# Patient Record
Sex: Female | Born: 1950 | Race: White | Hispanic: No | Marital: Married | Smoking: Never smoker
Health system: Southern US, Community
[De-identification: ages and names within clinical notes are randomized; demographics above are authoritative.]

## PROBLEM LIST (undated history)

## (undated) DIAGNOSIS — T7840XA Allergy, unspecified, initial encounter: Secondary | ICD-10-CM

## (undated) DIAGNOSIS — M1990 Unspecified osteoarthritis, unspecified site: Secondary | ICD-10-CM

## (undated) DIAGNOSIS — E785 Hyperlipidemia, unspecified: Secondary | ICD-10-CM

## (undated) HISTORY — DX: Unspecified osteoarthritis, unspecified site: M19.90

## (undated) HISTORY — DX: Allergy, unspecified, initial encounter: T78.40XA

## (undated) HISTORY — DX: Hyperlipidemia, unspecified: E78.5

---

## 2018-10-25 DIAGNOSIS — E785 Hyperlipidemia, unspecified: Secondary | ICD-10-CM | POA: Diagnosis not present

## 2018-11-29 DIAGNOSIS — Z1231 Encounter for screening mammogram for malignant neoplasm of breast: Secondary | ICD-10-CM | POA: Diagnosis not present

## 2019-03-15 ENCOUNTER — Other Ambulatory Visit: Payer: Self-pay

## 2019-03-15 DIAGNOSIS — Z20822 Contact with and (suspected) exposure to covid-19: Secondary | ICD-10-CM

## 2019-03-17 LAB — NOVEL CORONAVIRUS, NAA: SARS-CoV-2, NAA: NOT DETECTED

## 2019-03-26 DIAGNOSIS — Z136 Encounter for screening for cardiovascular disorders: Secondary | ICD-10-CM | POA: Diagnosis not present

## 2019-03-26 DIAGNOSIS — Z0000 Encounter for general adult medical examination without abnormal findings: Secondary | ICD-10-CM | POA: Diagnosis not present

## 2019-03-28 DIAGNOSIS — Z0000 Encounter for general adult medical examination without abnormal findings: Secondary | ICD-10-CM | POA: Diagnosis not present

## 2019-03-28 DIAGNOSIS — E785 Hyperlipidemia, unspecified: Secondary | ICD-10-CM | POA: Diagnosis not present

## 2019-04-02 DIAGNOSIS — H524 Presbyopia: Secondary | ICD-10-CM | POA: Diagnosis not present

## 2019-04-09 DIAGNOSIS — D225 Melanocytic nevi of trunk: Secondary | ICD-10-CM | POA: Diagnosis not present

## 2019-04-09 DIAGNOSIS — L821 Other seborrheic keratosis: Secondary | ICD-10-CM | POA: Diagnosis not present

## 2019-04-09 DIAGNOSIS — D2271 Melanocytic nevi of right lower limb, including hip: Secondary | ICD-10-CM | POA: Diagnosis not present

## 2019-04-09 DIAGNOSIS — L814 Other melanin hyperpigmentation: Secondary | ICD-10-CM | POA: Diagnosis not present

## 2019-04-09 DIAGNOSIS — Z85828 Personal history of other malignant neoplasm of skin: Secondary | ICD-10-CM | POA: Diagnosis not present

## 2019-04-09 DIAGNOSIS — D485 Neoplasm of uncertain behavior of skin: Secondary | ICD-10-CM | POA: Diagnosis not present

## 2019-04-09 DIAGNOSIS — L570 Actinic keratosis: Secondary | ICD-10-CM | POA: Diagnosis not present

## 2019-04-18 DIAGNOSIS — H5203 Hypermetropia, bilateral: Secondary | ICD-10-CM | POA: Diagnosis not present

## 2019-04-18 DIAGNOSIS — H524 Presbyopia: Secondary | ICD-10-CM | POA: Diagnosis not present

## 2019-04-18 DIAGNOSIS — H52209 Unspecified astigmatism, unspecified eye: Secondary | ICD-10-CM | POA: Diagnosis not present

## 2019-05-16 DIAGNOSIS — Z23 Encounter for immunization: Secondary | ICD-10-CM | POA: Diagnosis not present

## 2019-05-25 ENCOUNTER — Ambulatory Visit: Payer: Self-pay

## 2019-06-02 ENCOUNTER — Ambulatory Visit: Payer: Medicare HMO | Attending: Internal Medicine

## 2019-06-02 DIAGNOSIS — Z23 Encounter for immunization: Secondary | ICD-10-CM | POA: Insufficient documentation

## 2019-06-02 NOTE — Progress Notes (Signed)
Covid-19 Vaccination Clinic  Name:  SHALEI NEYLON    MRN: [id number] DOB: April 09, 1951  06/02/2019  Ms. Dadisman was observed post Covid-19 immunization for 15 minutes without incidence. She was provided with Vaccine Information Sheet and instruction to access the V-Safe system.   Ms. Labossiere was instructed to call 911 with any severe reactions post vaccine: Marland Kitchen Difficulty breathing  . Swelling of your face and throat  . A fast heartbeat  . A bad rash all over your body  . Dizziness and weakness    Immunizations Administered    Name Date Dose VIS Date Route   Pfizer COVID-19 Vaccine 06/02/2019  3:11 PM 0.3 mL 04/06/2019 Intramuscular   Manufacturer: [employer], [employer]   Lot: [id number]   NDC: [address]-3

## 2019-06-15 ENCOUNTER — Ambulatory Visit: Payer: Self-pay

## 2019-06-27 ENCOUNTER — Ambulatory Visit: Payer: Medicare HMO | Attending: Internal Medicine

## 2019-06-27 DIAGNOSIS — Z23 Encounter for immunization: Secondary | ICD-10-CM | POA: Insufficient documentation

## 2019-06-27 NOTE — Progress Notes (Signed)
Covid-19 Vaccination Clinic  Name:  Rachel Fisher    MRN: [id number] DOB: 02/27/1951  06/27/2019  [name] was observed post Covid-19 immunization for 15 minutes without incident. She was provided with Vaccine Information Sheet and instruction to access the V-Safe system.   [name] was instructed to call 911 with any severe reactions post vaccine: Marland Kitchen Difficulty breathing  . Swelling of face and throat  . A fast heartbeat  . A bad rash all over body  . Dizziness and weakness   Immunizations Administered    Name Date Dose VIS Date Route   Pfizer COVID-19 Vaccine 06/27/2019 11:22 AM 0.3 mL 04/06/2019 Intramuscular   Manufacturer: [employer], [employer]   Lot: [id number]   NDC: [address]-2

## 2019-10-22 DIAGNOSIS — M25561 Pain in right knee: Secondary | ICD-10-CM | POA: Diagnosis not present

## 2019-10-22 DIAGNOSIS — M1711 Unilateral primary osteoarthritis, right knee: Secondary | ICD-10-CM | POA: Diagnosis not present

## 2019-10-22 DIAGNOSIS — M170 Bilateral primary osteoarthritis of knee: Secondary | ICD-10-CM | POA: Diagnosis not present

## 2019-10-22 DIAGNOSIS — M1712 Unilateral primary osteoarthritis, left knee: Secondary | ICD-10-CM | POA: Diagnosis not present

## 2019-10-22 DIAGNOSIS — M25562 Pain in left knee: Secondary | ICD-10-CM | POA: Diagnosis not present

## 2019-11-30 DIAGNOSIS — Z1231 Encounter for screening mammogram for malignant neoplasm of breast: Secondary | ICD-10-CM | POA: Diagnosis not present

## 2020-02-05 DIAGNOSIS — M25561 Pain in right knee: Secondary | ICD-10-CM | POA: Diagnosis not present

## 2020-02-05 DIAGNOSIS — M25562 Pain in left knee: Secondary | ICD-10-CM | POA: Diagnosis not present

## 2020-02-05 DIAGNOSIS — M170 Bilateral primary osteoarthritis of knee: Secondary | ICD-10-CM | POA: Diagnosis not present

## 2020-03-18 DIAGNOSIS — M25562 Pain in left knee: Secondary | ICD-10-CM | POA: Diagnosis not present

## 2020-03-18 DIAGNOSIS — M1712 Unilateral primary osteoarthritis, left knee: Secondary | ICD-10-CM | POA: Diagnosis not present

## 2020-04-07 DIAGNOSIS — M25562 Pain in left knee: Secondary | ICD-10-CM | POA: Diagnosis not present

## 2020-04-07 DIAGNOSIS — Z01818 Encounter for other preprocedural examination: Secondary | ICD-10-CM | POA: Diagnosis not present

## 2020-04-07 DIAGNOSIS — E785 Hyperlipidemia, unspecified: Secondary | ICD-10-CM | POA: Diagnosis not present

## 2020-04-15 DIAGNOSIS — E785 Hyperlipidemia, unspecified: Secondary | ICD-10-CM | POA: Diagnosis not present

## 2020-04-15 DIAGNOSIS — M25569 Pain in unspecified knee: Secondary | ICD-10-CM | POA: Diagnosis not present

## 2020-04-15 DIAGNOSIS — Z01818 Encounter for other preprocedural examination: Secondary | ICD-10-CM | POA: Diagnosis not present

## 2020-04-15 DIAGNOSIS — M25562 Pain in left knee: Secondary | ICD-10-CM | POA: Diagnosis not present

## 2020-04-22 DIAGNOSIS — Z0000 Encounter for general adult medical examination without abnormal findings: Secondary | ICD-10-CM | POA: Diagnosis not present

## 2020-04-22 DIAGNOSIS — E2839 Other primary ovarian failure: Secondary | ICD-10-CM | POA: Diagnosis not present

## 2020-04-22 DIAGNOSIS — E785 Hyperlipidemia, unspecified: Secondary | ICD-10-CM | POA: Diagnosis not present

## 2020-04-24 DIAGNOSIS — M25562 Pain in left knee: Secondary | ICD-10-CM | POA: Diagnosis not present

## 2020-04-26 HISTORY — PX: KNEE SURGERY: SHX244

## 2020-05-05 DIAGNOSIS — Z20828 Contact with and (suspected) exposure to other viral communicable diseases: Secondary | ICD-10-CM | POA: Diagnosis not present

## 2020-05-07 DIAGNOSIS — G8918 Other acute postprocedural pain: Secondary | ICD-10-CM | POA: Diagnosis not present

## 2020-05-07 DIAGNOSIS — M1712 Unilateral primary osteoarthritis, left knee: Secondary | ICD-10-CM | POA: Diagnosis not present

## 2020-05-08 DIAGNOSIS — Z7982 Long term (current) use of aspirin: Secondary | ICD-10-CM | POA: Diagnosis not present

## 2020-05-08 DIAGNOSIS — Z471 Aftercare following joint replacement surgery: Secondary | ICD-10-CM | POA: Diagnosis not present

## 2020-05-08 DIAGNOSIS — Z96652 Presence of left artificial knee joint: Secondary | ICD-10-CM | POA: Diagnosis not present

## 2020-05-08 DIAGNOSIS — I10 Essential (primary) hypertension: Secondary | ICD-10-CM | POA: Diagnosis not present

## 2020-05-10 ENCOUNTER — Other Ambulatory Visit: Payer: Self-pay | Admitting: Family Medicine

## 2020-05-10 DIAGNOSIS — Z471 Aftercare following joint replacement surgery: Secondary | ICD-10-CM | POA: Diagnosis not present

## 2020-05-10 DIAGNOSIS — I10 Essential (primary) hypertension: Secondary | ICD-10-CM | POA: Diagnosis not present

## 2020-05-10 DIAGNOSIS — Z7982 Long term (current) use of aspirin: Secondary | ICD-10-CM | POA: Diagnosis not present

## 2020-05-10 DIAGNOSIS — Z96652 Presence of left artificial knee joint: Secondary | ICD-10-CM | POA: Diagnosis not present

## 2020-05-10 DIAGNOSIS — E2839 Other primary ovarian failure: Secondary | ICD-10-CM

## 2020-05-12 DIAGNOSIS — Z7982 Long term (current) use of aspirin: Secondary | ICD-10-CM | POA: Diagnosis not present

## 2020-05-12 DIAGNOSIS — Z471 Aftercare following joint replacement surgery: Secondary | ICD-10-CM | POA: Diagnosis not present

## 2020-05-12 DIAGNOSIS — I10 Essential (primary) hypertension: Secondary | ICD-10-CM | POA: Diagnosis not present

## 2020-05-12 DIAGNOSIS — Z96652 Presence of left artificial knee joint: Secondary | ICD-10-CM | POA: Diagnosis not present

## 2020-05-14 DIAGNOSIS — I10 Essential (primary) hypertension: Secondary | ICD-10-CM | POA: Diagnosis not present

## 2020-05-14 DIAGNOSIS — Z471 Aftercare following joint replacement surgery: Secondary | ICD-10-CM | POA: Diagnosis not present

## 2020-05-14 DIAGNOSIS — Z7982 Long term (current) use of aspirin: Secondary | ICD-10-CM | POA: Diagnosis not present

## 2020-05-14 DIAGNOSIS — Z96652 Presence of left artificial knee joint: Secondary | ICD-10-CM | POA: Diagnosis not present

## 2020-05-16 DIAGNOSIS — I10 Essential (primary) hypertension: Secondary | ICD-10-CM | POA: Diagnosis not present

## 2020-05-16 DIAGNOSIS — Z471 Aftercare following joint replacement surgery: Secondary | ICD-10-CM | POA: Diagnosis not present

## 2020-05-16 DIAGNOSIS — Z7982 Long term (current) use of aspirin: Secondary | ICD-10-CM | POA: Diagnosis not present

## 2020-05-16 DIAGNOSIS — Z96652 Presence of left artificial knee joint: Secondary | ICD-10-CM | POA: Diagnosis not present

## 2020-05-20 DIAGNOSIS — Z471 Aftercare following joint replacement surgery: Secondary | ICD-10-CM | POA: Diagnosis not present

## 2020-05-20 DIAGNOSIS — Z96652 Presence of left artificial knee joint: Secondary | ICD-10-CM | POA: Diagnosis not present

## 2020-05-20 DIAGNOSIS — M25662 Stiffness of left knee, not elsewhere classified: Secondary | ICD-10-CM | POA: Diagnosis not present

## 2020-05-20 DIAGNOSIS — M6281 Muscle weakness (generalized): Secondary | ICD-10-CM | POA: Diagnosis not present

## 2020-05-22 DIAGNOSIS — M6281 Muscle weakness (generalized): Secondary | ICD-10-CM | POA: Diagnosis not present

## 2020-05-22 DIAGNOSIS — Z96652 Presence of left artificial knee joint: Secondary | ICD-10-CM | POA: Diagnosis not present

## 2020-05-22 DIAGNOSIS — M25662 Stiffness of left knee, not elsewhere classified: Secondary | ICD-10-CM | POA: Diagnosis not present

## 2020-05-28 DIAGNOSIS — M25662 Stiffness of left knee, not elsewhere classified: Secondary | ICD-10-CM | POA: Diagnosis not present

## 2020-05-28 DIAGNOSIS — M6281 Muscle weakness (generalized): Secondary | ICD-10-CM | POA: Diagnosis not present

## 2020-05-28 DIAGNOSIS — Z96652 Presence of left artificial knee joint: Secondary | ICD-10-CM | POA: Diagnosis not present

## 2020-05-30 DIAGNOSIS — M25662 Stiffness of left knee, not elsewhere classified: Secondary | ICD-10-CM | POA: Diagnosis not present

## 2020-05-30 DIAGNOSIS — Z96652 Presence of left artificial knee joint: Secondary | ICD-10-CM | POA: Diagnosis not present

## 2020-05-30 DIAGNOSIS — M6281 Muscle weakness (generalized): Secondary | ICD-10-CM | POA: Diagnosis not present

## 2020-06-02 DIAGNOSIS — Z96652 Presence of left artificial knee joint: Secondary | ICD-10-CM | POA: Diagnosis not present

## 2020-06-02 DIAGNOSIS — M25662 Stiffness of left knee, not elsewhere classified: Secondary | ICD-10-CM | POA: Diagnosis not present

## 2020-06-02 DIAGNOSIS — M6281 Muscle weakness (generalized): Secondary | ICD-10-CM | POA: Diagnosis not present

## 2020-06-05 DIAGNOSIS — M6281 Muscle weakness (generalized): Secondary | ICD-10-CM | POA: Diagnosis not present

## 2020-06-05 DIAGNOSIS — M25662 Stiffness of left knee, not elsewhere classified: Secondary | ICD-10-CM | POA: Diagnosis not present

## 2020-06-05 DIAGNOSIS — Z96652 Presence of left artificial knee joint: Secondary | ICD-10-CM | POA: Diagnosis not present

## 2020-06-06 DIAGNOSIS — M6281 Muscle weakness (generalized): Secondary | ICD-10-CM | POA: Diagnosis not present

## 2020-06-06 DIAGNOSIS — Z96652 Presence of left artificial knee joint: Secondary | ICD-10-CM | POA: Diagnosis not present

## 2020-06-06 DIAGNOSIS — M25662 Stiffness of left knee, not elsewhere classified: Secondary | ICD-10-CM | POA: Diagnosis not present

## 2020-06-09 DIAGNOSIS — Z96652 Presence of left artificial knee joint: Secondary | ICD-10-CM | POA: Diagnosis not present

## 2020-06-09 DIAGNOSIS — M25662 Stiffness of left knee, not elsewhere classified: Secondary | ICD-10-CM | POA: Diagnosis not present

## 2020-06-09 DIAGNOSIS — M6281 Muscle weakness (generalized): Secondary | ICD-10-CM | POA: Diagnosis not present

## 2020-06-11 DIAGNOSIS — M25662 Stiffness of left knee, not elsewhere classified: Secondary | ICD-10-CM | POA: Diagnosis not present

## 2020-06-11 DIAGNOSIS — M6281 Muscle weakness (generalized): Secondary | ICD-10-CM | POA: Diagnosis not present

## 2020-06-11 DIAGNOSIS — Z96652 Presence of left artificial knee joint: Secondary | ICD-10-CM | POA: Diagnosis not present

## 2020-06-13 DIAGNOSIS — M25662 Stiffness of left knee, not elsewhere classified: Secondary | ICD-10-CM | POA: Diagnosis not present

## 2020-06-13 DIAGNOSIS — Z96652 Presence of left artificial knee joint: Secondary | ICD-10-CM | POA: Diagnosis not present

## 2020-06-13 DIAGNOSIS — M6281 Muscle weakness (generalized): Secondary | ICD-10-CM | POA: Diagnosis not present

## 2020-06-17 DIAGNOSIS — Z96652 Presence of left artificial knee joint: Secondary | ICD-10-CM | POA: Diagnosis not present

## 2020-06-17 DIAGNOSIS — M6281 Muscle weakness (generalized): Secondary | ICD-10-CM | POA: Diagnosis not present

## 2020-06-17 DIAGNOSIS — M25662 Stiffness of left knee, not elsewhere classified: Secondary | ICD-10-CM | POA: Diagnosis not present

## 2020-06-19 DIAGNOSIS — M6281 Muscle weakness (generalized): Secondary | ICD-10-CM | POA: Diagnosis not present

## 2020-06-19 DIAGNOSIS — Z96652 Presence of left artificial knee joint: Secondary | ICD-10-CM | POA: Diagnosis not present

## 2020-06-19 DIAGNOSIS — M25662 Stiffness of left knee, not elsewhere classified: Secondary | ICD-10-CM | POA: Diagnosis not present

## 2020-06-24 DIAGNOSIS — M6281 Muscle weakness (generalized): Secondary | ICD-10-CM | POA: Diagnosis not present

## 2020-06-24 DIAGNOSIS — Z96652 Presence of left artificial knee joint: Secondary | ICD-10-CM | POA: Diagnosis not present

## 2020-06-24 DIAGNOSIS — M25662 Stiffness of left knee, not elsewhere classified: Secondary | ICD-10-CM | POA: Diagnosis not present

## 2020-06-25 DIAGNOSIS — Z96652 Presence of left artificial knee joint: Secondary | ICD-10-CM | POA: Diagnosis not present

## 2020-06-25 DIAGNOSIS — M25662 Stiffness of left knee, not elsewhere classified: Secondary | ICD-10-CM | POA: Diagnosis not present

## 2020-06-25 DIAGNOSIS — M6281 Muscle weakness (generalized): Secondary | ICD-10-CM | POA: Diagnosis not present

## 2020-07-07 DIAGNOSIS — M6281 Muscle weakness (generalized): Secondary | ICD-10-CM | POA: Diagnosis not present

## 2020-07-07 DIAGNOSIS — M25662 Stiffness of left knee, not elsewhere classified: Secondary | ICD-10-CM | POA: Diagnosis not present

## 2020-07-07 DIAGNOSIS — Z96652 Presence of left artificial knee joint: Secondary | ICD-10-CM | POA: Diagnosis not present

## 2020-07-09 DIAGNOSIS — Z96652 Presence of left artificial knee joint: Secondary | ICD-10-CM | POA: Diagnosis not present

## 2020-07-09 DIAGNOSIS — M6281 Muscle weakness (generalized): Secondary | ICD-10-CM | POA: Diagnosis not present

## 2020-07-09 DIAGNOSIS — M25662 Stiffness of left knee, not elsewhere classified: Secondary | ICD-10-CM | POA: Diagnosis not present

## 2020-07-11 DIAGNOSIS — Z96652 Presence of left artificial knee joint: Secondary | ICD-10-CM | POA: Diagnosis not present

## 2020-07-11 DIAGNOSIS — M6281 Muscle weakness (generalized): Secondary | ICD-10-CM | POA: Diagnosis not present

## 2020-07-11 DIAGNOSIS — M25662 Stiffness of left knee, not elsewhere classified: Secondary | ICD-10-CM | POA: Diagnosis not present

## 2020-07-14 DIAGNOSIS — M6281 Muscle weakness (generalized): Secondary | ICD-10-CM | POA: Diagnosis not present

## 2020-07-14 DIAGNOSIS — Z96652 Presence of left artificial knee joint: Secondary | ICD-10-CM | POA: Diagnosis not present

## 2020-07-14 DIAGNOSIS — M25662 Stiffness of left knee, not elsewhere classified: Secondary | ICD-10-CM | POA: Diagnosis not present

## 2020-07-16 DIAGNOSIS — Z96652 Presence of left artificial knee joint: Secondary | ICD-10-CM | POA: Diagnosis not present

## 2020-07-16 DIAGNOSIS — M6281 Muscle weakness (generalized): Secondary | ICD-10-CM | POA: Diagnosis not present

## 2020-07-16 DIAGNOSIS — M25662 Stiffness of left knee, not elsewhere classified: Secondary | ICD-10-CM | POA: Diagnosis not present

## 2020-07-23 DIAGNOSIS — Z96652 Presence of left artificial knee joint: Secondary | ICD-10-CM | POA: Diagnosis not present

## 2020-07-23 DIAGNOSIS — M6281 Muscle weakness (generalized): Secondary | ICD-10-CM | POA: Diagnosis not present

## 2020-07-23 DIAGNOSIS — M25662 Stiffness of left knee, not elsewhere classified: Secondary | ICD-10-CM | POA: Diagnosis not present

## 2020-07-25 DIAGNOSIS — Z96652 Presence of left artificial knee joint: Secondary | ICD-10-CM | POA: Diagnosis not present

## 2020-07-25 DIAGNOSIS — M25662 Stiffness of left knee, not elsewhere classified: Secondary | ICD-10-CM | POA: Diagnosis not present

## 2020-07-25 DIAGNOSIS — M6281 Muscle weakness (generalized): Secondary | ICD-10-CM | POA: Diagnosis not present

## 2020-07-31 DIAGNOSIS — Z96652 Presence of left artificial knee joint: Secondary | ICD-10-CM | POA: Diagnosis not present

## 2020-07-31 DIAGNOSIS — Z471 Aftercare following joint replacement surgery: Secondary | ICD-10-CM | POA: Diagnosis not present

## 2020-07-31 DIAGNOSIS — M25662 Stiffness of left knee, not elsewhere classified: Secondary | ICD-10-CM | POA: Diagnosis not present

## 2020-07-31 DIAGNOSIS — M6281 Muscle weakness (generalized): Secondary | ICD-10-CM | POA: Diagnosis not present

## 2020-09-02 DIAGNOSIS — L821 Other seborrheic keratosis: Secondary | ICD-10-CM | POA: Diagnosis not present

## 2020-09-02 DIAGNOSIS — L814 Other melanin hyperpigmentation: Secondary | ICD-10-CM | POA: Diagnosis not present

## 2020-09-02 DIAGNOSIS — D1801 Hemangioma of skin and subcutaneous tissue: Secondary | ICD-10-CM | POA: Diagnosis not present

## 2020-09-02 DIAGNOSIS — L905 Scar conditions and fibrosis of skin: Secondary | ICD-10-CM | POA: Diagnosis not present

## 2020-09-02 DIAGNOSIS — Z85828 Personal history of other malignant neoplasm of skin: Secondary | ICD-10-CM | POA: Diagnosis not present

## 2020-09-05 ENCOUNTER — Ambulatory Visit
Admission: RE | Admit: 2020-09-05 | Discharge: 2020-09-05 | Disposition: A | Discharge status: Home / Self Care | Payer: Medicare HMO | Source: Ambulatory Visit | Attending: Family Medicine | Admitting: Family Medicine

## 2020-09-05 ENCOUNTER — Other Ambulatory Visit: Payer: Self-pay

## 2020-09-05 DIAGNOSIS — E2839 Other primary ovarian failure: Secondary | ICD-10-CM

## 2020-09-05 DIAGNOSIS — M85852 Other specified disorders of bone density and structure, left thigh: Secondary | ICD-10-CM | POA: Diagnosis not present

## 2020-09-05 DIAGNOSIS — Z780 Asymptomatic menopausal state: Secondary | ICD-10-CM | POA: Diagnosis not present

## 2020-11-10 DIAGNOSIS — Z96652 Presence of left artificial knee joint: Secondary | ICD-10-CM | POA: Diagnosis not present

## 2020-11-10 DIAGNOSIS — Z471 Aftercare following joint replacement surgery: Secondary | ICD-10-CM | POA: Diagnosis not present

## 2020-12-24 DIAGNOSIS — Z1231 Encounter for screening mammogram for malignant neoplasm of breast: Secondary | ICD-10-CM | POA: Diagnosis not present

## 2021-01-19 DIAGNOSIS — J0110 Acute frontal sinusitis, unspecified: Secondary | ICD-10-CM | POA: Diagnosis not present

## 2021-03-26 DIAGNOSIS — H52209 Unspecified astigmatism, unspecified eye: Secondary | ICD-10-CM | POA: Diagnosis not present

## 2021-03-26 DIAGNOSIS — H5203 Hypermetropia, bilateral: Secondary | ICD-10-CM | POA: Diagnosis not present

## 2021-03-26 DIAGNOSIS — H524 Presbyopia: Secondary | ICD-10-CM | POA: Diagnosis not present

## 2021-05-04 DIAGNOSIS — M8580 Other specified disorders of bone density and structure, unspecified site: Secondary | ICD-10-CM | POA: Diagnosis not present

## 2021-05-04 DIAGNOSIS — Z0000 Encounter for general adult medical examination without abnormal findings: Secondary | ICD-10-CM | POA: Diagnosis not present

## 2021-05-04 DIAGNOSIS — E785 Hyperlipidemia, unspecified: Secondary | ICD-10-CM | POA: Diagnosis not present

## 2021-05-05 DIAGNOSIS — E785 Hyperlipidemia, unspecified: Secondary | ICD-10-CM | POA: Diagnosis not present

## 2021-05-05 DIAGNOSIS — Z0000 Encounter for general adult medical examination without abnormal findings: Secondary | ICD-10-CM | POA: Diagnosis not present

## 2021-12-01 DIAGNOSIS — D2271 Melanocytic nevi of right lower limb, including hip: Secondary | ICD-10-CM | POA: Diagnosis not present

## 2021-12-01 DIAGNOSIS — Z08 Encounter for follow-up examination after completed treatment for malignant neoplasm: Secondary | ICD-10-CM | POA: Diagnosis not present

## 2021-12-01 DIAGNOSIS — L821 Other seborrheic keratosis: Secondary | ICD-10-CM | POA: Diagnosis not present

## 2021-12-01 DIAGNOSIS — L570 Actinic keratosis: Secondary | ICD-10-CM | POA: Diagnosis not present

## 2021-12-01 DIAGNOSIS — L280 Lichen simplex chronicus: Secondary | ICD-10-CM | POA: Diagnosis not present

## 2021-12-01 DIAGNOSIS — D225 Melanocytic nevi of trunk: Secondary | ICD-10-CM | POA: Diagnosis not present

## 2021-12-01 DIAGNOSIS — D485 Neoplasm of uncertain behavior of skin: Secondary | ICD-10-CM | POA: Diagnosis not present

## 2021-12-01 DIAGNOSIS — Z85828 Personal history of other malignant neoplasm of skin: Secondary | ICD-10-CM | POA: Diagnosis not present

## 2021-12-01 DIAGNOSIS — L814 Other melanin hyperpigmentation: Secondary | ICD-10-CM | POA: Diagnosis not present

## 2021-12-30 DIAGNOSIS — Z1231 Encounter for screening mammogram for malignant neoplasm of breast: Secondary | ICD-10-CM | POA: Diagnosis not present

## 2022-03-04 DIAGNOSIS — Z822 Family history of deafness and hearing loss: Secondary | ICD-10-CM | POA: Diagnosis not present

## 2022-03-04 DIAGNOSIS — H903 Sensorineural hearing loss, bilateral: Secondary | ICD-10-CM | POA: Diagnosis not present

## 2022-03-29 DIAGNOSIS — H2513 Age-related nuclear cataract, bilateral: Secondary | ICD-10-CM | POA: Diagnosis not present

## 2022-04-02 DIAGNOSIS — H5203 Hypermetropia, bilateral: Secondary | ICD-10-CM | POA: Diagnosis not present

## 2022-04-02 DIAGNOSIS — H52209 Unspecified astigmatism, unspecified eye: Secondary | ICD-10-CM | POA: Diagnosis not present

## 2022-05-12 DIAGNOSIS — M8580 Other specified disorders of bone density and structure, unspecified site: Secondary | ICD-10-CM | POA: Diagnosis not present

## 2022-05-12 DIAGNOSIS — E785 Hyperlipidemia, unspecified: Secondary | ICD-10-CM | POA: Diagnosis not present

## 2022-05-12 DIAGNOSIS — M8588 Other specified disorders of bone density and structure, other site: Secondary | ICD-10-CM | POA: Diagnosis not present

## 2022-05-12 DIAGNOSIS — M79604 Pain in right leg: Secondary | ICD-10-CM | POA: Diagnosis not present

## 2022-05-12 DIAGNOSIS — Z0000 Encounter for general adult medical examination without abnormal findings: Secondary | ICD-10-CM | POA: Diagnosis not present

## 2022-05-18 ENCOUNTER — Other Ambulatory Visit: Payer: Self-pay | Admitting: Family Medicine

## 2022-05-18 DIAGNOSIS — M8580 Other specified disorders of bone density and structure, unspecified site: Secondary | ICD-10-CM

## 2022-05-25 DIAGNOSIS — M25571 Pain in right ankle and joints of right foot: Secondary | ICD-10-CM | POA: Diagnosis not present

## 2022-05-25 DIAGNOSIS — M25572 Pain in left ankle and joints of left foot: Secondary | ICD-10-CM | POA: Insufficient documentation

## 2022-05-25 DIAGNOSIS — M7671 Peroneal tendinitis, right leg: Secondary | ICD-10-CM | POA: Diagnosis not present

## 2022-05-25 DIAGNOSIS — S93491A Sprain of other ligament of right ankle, initial encounter: Secondary | ICD-10-CM | POA: Diagnosis not present

## 2022-06-09 DIAGNOSIS — L609 Nail disorder, unspecified: Secondary | ICD-10-CM | POA: Diagnosis not present

## 2022-06-09 DIAGNOSIS — B351 Tinea unguium: Secondary | ICD-10-CM | POA: Diagnosis not present

## 2022-06-17 NOTE — Progress Notes (Signed)
Rachel Fisher Phone: [phone] Subjective:   Rachel Fisher, am serving as a scribe for Dr. Hulan Saas.  I'm seeing this patient by the request  of:  Rachel Pepper, MD  CC: Bilateral leg pain  [id number]  Rachel Fisher is a 72 y.o. female coming in with complaint of leg pain for over 10 years that is worsening lately. Patient states that if she sits for 30 minutes and gets up she said that both legs are painful. Pain will last for a few minutes. The entire leg is painful. Sprained her ankle a few weeks ago and saw another provider who gave her meloxicam. Medication is helping leg pain. Denies any numbness or tingling. Denies having any injury to her back or legs.  Has responded to the meloxicam that was given to her by primary care provider.         Current Outpatient Medications (Cardiovascular):    rosuvastatin (CRESTOR) 20 MG tablet, Take 20 mg by mouth daily.   Current Outpatient Medications (Analgesics):    meloxicam (MOBIC) 7.5 MG tablet, Take 7.5 mg by mouth daily.  Current Outpatient Medications (Hematological):    cyanocobalamin (VITAMIN B12) 1000 MCG tablet, Take 1,000 mcg by mouth daily.  Current Outpatient Medications (Other):    calcium carbonate (TITRALAC) 420 MG CHEW chewable tablet, Chew 420 mg by mouth.   gabapentin (NEURONTIN) 100 MG capsule, Take 2 capsules (200 mg total) by mouth at bedtime.   Omega-3 Fatty Acids (FISH OIL) 1000 MG CPDR, Take by mouth.   Reviewed prior external information including notes and imaging from  primary care provider As well as notes that were available from care everywhere and other healthcare systems.  Past medical history, social, surgical and family history all reviewed in electronic medical record.  Fisher pertanent information unless stated regarding to the chief complaint.   Review of Systems:  Fisher headache, visual changes, nausea,  vomiting, diarrhea, constipation, dizziness, abdominal pain, skin rash, fevers, chills, night sweats, weight loss, swollen lymph nodes,  joint swelling, chest pain, shortness of breath, mood changes. POSITIVE muscle aches, body aches  Objective  Blood pressure 124/72, pulse 68, height '5\' 2"'$  (1.575 m), weight 135 lb (61.2 kg), SpO2 97 %.   General: Fisher apparent distress alert and oriented x3 mood and affect normal, dressed appropriately.  HEENT: Pupils equal, extraocular movements intact  Respiratory: Patient's speak in full sentences and does not appear short of breath  Cardiovascular: Fisher lower extremity edema, non tender, Fisher erythema  Bilateral legs do have tenderness to palpation noted.  Patient has negative straight leg test.  Tightness with FABER test bilaterally.  Dorsalis pedis pulses felt very brisk but may be 1+ of the posterior tibialis.  But this seems to be symmetric to the contralateral side.  Low back does have some loss of lordosis noted.  Tightness once again with FABER test.  Neurovascularly intact distally.  [id number]; 15 additional minutes spent for Therapeutic exercises as stated in above notes.  This included exercises focusing on stretching, strengthening, with significant focus on eccentric aspects.   Long term goals include an improvement in range of motion, strength, endurance as well as avoiding reinjury. Patient's frequency would include in 1-2 times a day, 3-5 times a week for a duration of 6-12 weeks. Low back exercises that included:  Pelvic tilt/bracing instruction to focus on control of the pelvic girdle and lower abdominal muscles  Glute strengthening exercises, focusing  on proper firing of the glutes without engaging the low back muscles Proper stretching techniques for maximum relief for the hamstrings, hip flexors, low back and some rotation where tolerated  Proper technique shown and discussed handout in great detail with ATC.  All questions were discussed and answered.       Impression and Recommendations:

## 2022-06-21 ENCOUNTER — Ambulatory Visit (INDEPENDENT_AMBULATORY_CARE_PROVIDER_SITE_OTHER): Payer: HMO

## 2022-06-21 ENCOUNTER — Ambulatory Visit (INDEPENDENT_AMBULATORY_CARE_PROVIDER_SITE_OTHER): Payer: HMO | Admitting: Family Medicine

## 2022-06-21 ENCOUNTER — Ambulatory Visit: Payer: Self-pay

## 2022-06-21 VITALS — BP 124/72 | HR 68 | Ht 62.0 in | Wt 135.0 lb

## 2022-06-21 DIAGNOSIS — M545 Low back pain, unspecified: Secondary | ICD-10-CM

## 2022-06-21 DIAGNOSIS — M79601 Pain in right arm: Secondary | ICD-10-CM | POA: Diagnosis not present

## 2022-06-21 DIAGNOSIS — M79605 Pain in left leg: Secondary | ICD-10-CM

## 2022-06-21 DIAGNOSIS — M79604 Pain in right leg: Secondary | ICD-10-CM | POA: Diagnosis not present

## 2022-06-21 MED ORDER — GABAPENTIN 100 MG PO CAPS: 200.0000 mg | ORAL_CAPSULE | Freq: Every day | ORAL | 0 refills | Status: DC

## 2022-06-21 NOTE — Assessment & Plan Note (Addendum)
I believe the patient bilateral leg pain does seem to be more secondary to more of a neurogenic aspect.  Will get x-rays of the back and may need to consider the possibility of nerve conduction test or advanced imaging if this continues.  Did see her great primary care physician and did have workup for some iron deficiency.  Patient declines any type of snoring aspect.  Differential includes still more of a tendinitis but does not seem to respond that way.  Could be potentially vascular and will get ABI but I think it is highly unlikely.  Increase activity slowly as well.  Discussed exercises and work with Product/process development scientist today.  Follow-up with me again in 6 to 8 weeks

## 2022-06-21 NOTE — Patient Instructions (Addendum)
[facility]  ([facility] [employer] in [facility]) [address], [address] [place], [place] [address] [phone] 07/02/2022 1:30p (Arrive 15 minutes early)   Gabapentin '200mg'$  at night Xray  Exercises See me in 6-8 weeks

## 2022-07-02 ENCOUNTER — Other Ambulatory Visit: Payer: Self-pay | Admitting: Family Medicine

## 2022-07-02 ENCOUNTER — Ambulatory Visit (HOSPITAL_COMMUNITY)
Admission: RE | Admit: 2022-07-02 | Discharge: 2022-07-02 | Disposition: A | Discharge status: Home / Self Care | Payer: HMO | Source: Ambulatory Visit | Attending: Family Medicine | Admitting: Family Medicine

## 2022-07-02 ENCOUNTER — Other Ambulatory Visit: Payer: Self-pay

## 2022-07-02 DIAGNOSIS — R6889 Other general symptoms and signs: Secondary | ICD-10-CM

## 2022-07-02 DIAGNOSIS — M79604 Pain in right leg: Secondary | ICD-10-CM

## 2022-07-02 DIAGNOSIS — M79605 Pain in left leg: Secondary | ICD-10-CM | POA: Insufficient documentation

## 2022-07-05 LAB — VAS US LOWER EXT ART SEG MULTI (SEGMENTALS & LE RAYNAUDS)
Left ABI: 1.23
Right ABI: 1.22

## 2022-07-06 ENCOUNTER — Encounter: Payer: Self-pay | Admitting: Family Medicine

## 2022-07-07 ENCOUNTER — Ambulatory Visit (INDEPENDENT_AMBULATORY_CARE_PROVIDER_SITE_OTHER): Payer: HMO | Admitting: Vascular Surgery

## 2022-07-07 ENCOUNTER — Encounter: Payer: Self-pay | Admitting: Vascular Surgery

## 2022-07-07 VITALS — BP 133/87 | HR 75 | Temp 98.7°F | Resp 20 | Ht 62.0 in | Wt 136.8 lb

## 2022-07-07 DIAGNOSIS — M25562 Pain in left knee: Secondary | ICD-10-CM

## 2022-07-07 DIAGNOSIS — M25561 Pain in right knee: Secondary | ICD-10-CM

## 2022-07-07 NOTE — Progress Notes (Signed)
Patient ID: Rachel Fisher, female   DOB: August 05, 1950, 72 y.o.   MRN: [id number]  Reason for Consult: New Patient (Initial Visit)   Referred by Farris Has, MD  Subjective:     HPI:  Rachel Fisher is a 72 y.o. female without significant vascular history.  She states that she is able to walk 18 holes of golf without any limitations she did have a knee replacement a couple years ago on the left.  She states that she has pain after standing when she is sitting especially after long drives but also happens when she has only been sitting for short times.  She does have known arthritis possibly affecting her spine as well never had any spinal instrumentation.  She denies any tissue loss or ulceration.  No history of stroke, TIA or amaurosis no personal or family history of aneurysm disease.  Past Medical History:  Diagnosis Date   Allergy    Arthritis    Hyperlipidemia    History reviewed. No pertinent family history. Past Surgical History:  Procedure Laterality Date   CESAREAN SECTION     2 times   KNEE SURGERY Left 2022    Short Social History:  Social History   Tobacco Use   Smoking status: Never   Smokeless tobacco: Never  Substance Use Topics   Alcohol use: Yes    Comment: occasional    Allergies  Allergen Reactions   Oxycodone Nausea And Vomiting    Patient states she cannot tolerate any narcotics   Oxycodone Hcl Nausea And Vomiting   Tramadol Hcl Nausea And Vomiting   Sulfa Antibiotics Itching and Rash    Current Outpatient Medications  Medication Sig Dispense Refill   calcium carbonate (OS-CAL - DOSED IN MG OF ELEMENTAL CALCIUM) 1250 (500 Ca) MG tablet Take 1 tablet by mouth daily with breakfast.     cyanocobalamin (VITAMIN B12) 1000 MCG tablet Take 1,000 mcg by mouth daily.     gabapentin (NEURONTIN) 100 MG capsule Take 2 capsules (200 mg total) by mouth at bedtime. 180 capsule 0   loratadine (CLARITIN) 10 MG tablet Take 10 mg by mouth daily  as needed.     Omega-3 Fatty Acids (FISH OIL) 1000 MG CPDR Take by mouth.     rosuvastatin (CRESTOR) 20 MG tablet Take 20 mg by mouth daily.     VOLTAREN 1 % GEL APPLY 2 GRAMS TO THE AFFECTED AREA(S) BY TOPICAL ROUTE 4 TIMES PER DAY     No current facility-administered medications for this visit.    Review of Systems  Constitutional:  Constitutional negative. HENT: HENT negative.  Eyes: Eyes negative.  Respiratory: Respiratory negative.  Cardiovascular: Cardiovascular negative.  GI: Gastrointestinal negative.  Musculoskeletal: Positive for leg pain.  Neurological: Positive for numbness.  Hematologic: Hematologic/lymphatic negative.  Psychiatric: Psychiatric negative.        Objective:  Objective   Vitals:   07/07/22 1343  BP: 133/87  Pulse: 75  Resp: 20  Temp: 98.7 F (37.1 C)  SpO2: 97%     Physical Exam HENT:     Head: Normocephalic.     Nose: Nose normal.  Eyes:     Pupils: Pupils are equal, round, and reactive to light.  Cardiovascular:     Rate and Rhythm: Normal rate.     Pulses: Normal pulses.  Pulmonary:     Effort: Pulmonary effort is normal.  Abdominal:     General: Abdomen is flat.     Palpations: Abdomen is  soft.  Musculoskeletal:        General: Normal range of motion.     Cervical back: Normal range of motion.     Right lower leg: No edema.     Left lower leg: No edema.  Skin:    General: Skin is warm.     Capillary Refill: Capillary refill takes less than 2 seconds.  Neurological:     General: No focal deficit present.     Mental Status: She is alert.  Psychiatric:        Mood and Affect: Mood normal.        Thought Content: Thought content normal.        Judgment: Judgment normal.     Data: ABI Findings:  +---------+------------------+-----+---------+--------+  Right   Rt Pressure (mmHg)IndexWaveform Comment   +---------+------------------+-----+---------+--------+  Brachial 138                                        +---------+------------------+-----+---------+--------+  CFA                            triphasic          +---------+------------------+-----+---------+--------+  Popliteal                      triphasic          +---------+------------------+-----+---------+--------+  PTA     169               1.22 triphasic          +---------+------------------+-----+---------+--------+  PERO    164               1.19 triphasic          +---------+------------------+-----+---------+--------+  DP      158               1.14 triphasic          +---------+------------------+-----+---------+--------+  Great Toe93                0.67 Abnormal           +---------+------------------+-----+---------+--------+   +---------+------------------+-----+---------+-------+  Left    Lt Pressure (mmHg)IndexWaveform Comment  +---------+------------------+-----+---------+-------+  Brachial 134                                      +---------+------------------+-----+---------+-------+  CFA                            triphasic         +---------+------------------+-----+---------+-------+  Popliteal                      triphasic         +---------+------------------+-----+---------+-------+  PTA     170               1.23 triphasic         +---------+------------------+-----+---------+-------+  PERO    162               1.17 triphasic         +---------+------------------+-----+---------+-------+  DP      168  1.22 triphasic         +---------+------------------+-----+---------+-------+  Great Toe90                0.65 Abnormal          +---------+------------------+-----+---------+-------+   +-------+-----------+-----------+------------+------------+  ABI/TBIToday's ABIToday's TBIPrevious ABIPrevious TBI  +-------+-----------+-----------+------------+------------+  Right 1.22       0.67                                  +-------+-----------+-----------+------------+------------+  Left  1.23       0.65                                 +-------+-----------+-----------+------------+------------+           Summary:  Right: Resting right ankle-brachial index is within normal range. The  right toe-brachial index is abnormal.   Left: Resting left ankle-brachial index is within normal range. The left  toe-brachial index is abnormal.       Assessment/Plan:    72 year old female with leg pain with associated numbness upon standing does not appear to vascular in nature particularly given the strong pulses and normal triphasic ABIs although toe pressures are somewhat decreased are still far above the range that I would be suspicious of underlying vascular disease.  As such she should continue to be active and can follow-up with me on an as-needed basis.      Maeola Harman MD [facility]

## 2022-07-13 MED ORDER — DULOXETINE HCL 20 MG PO CPEP: 20.0000 mg | ORAL_CAPSULE | Freq: Every day | ORAL | 3 refills | Status: DC

## 2022-07-14 DIAGNOSIS — B351 Tinea unguium: Secondary | ICD-10-CM | POA: Diagnosis not present

## 2022-08-09 NOTE — Progress Notes (Unsigned)
[facility] [address] [place] [address] Phone: [phone] Subjective:   Bruce Donath, am serving as a scribe for Dr. Antoine Primas.  I'm seeing this patient by the request  of:  Farris Has, MD  CC: Bilateral leg pain  [id number]  06/21/2022 I believe the patient bilateral leg pain does seem to be more secondary to more of a neurogenic aspect.  Will get x-rays of the back and may need to consider the possibility of nerve conduction test or advanced imaging if this continues.  Did see her great primary care physician and did have workup for some iron deficiency.  Patient declines any type of snoring aspect.  Differential includes still more of a tendinitis but does not seem to respond that way.  Could be potentially vascular and will get ABI but I think it is highly unlikely.  Increase activity slowly as well.  Discussed exercises and work with Event organiser today.  Follow-up with me again in 6 to 8 weeks     Update 08/10/2022 BAANI ANGLES is a 72 y.o. female coming in with complaint of B leg pain.  Started on Cymbalta at last visit patient states that for past 2 weeks she has been getting worse. Cymbalta is helping but is not resolved her pain completely. States that she lives off of Tylenol due to leg pain.   Family history of sacroiliitis in her family and she wants to know if this is a possible differential diagnosis for her.   Took 2 gabapentin but was not able to function the following day. Then tried 1 gabapentin but has recently stopped gabapentin all together.    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  Questionable kidney stone in the right kidney.  ABIs were abnormal.Was seen by vascular who stated that they felt comfortable with the ABI at the moment.  Scheduled for bone density in August 2024  Past Medical History:  Diagnosis Date   Allergy    Arthritis    Hyperlipidemia    Past  Surgical History:  Procedure Laterality Date   CESAREAN SECTION     2 times   KNEE SURGERY Left 2022   Social History   Socioeconomic History   Marital status: Married    Spouse name: Not on file   Number of children: Not on file   Years of education: Not on file   Highest education level: Not on file  Occupational History   Not on file  Tobacco Use   Smoking status: Never   Smokeless tobacco: Never  Substance and Sexual Activity   Alcohol use: Yes    Comment: occasional   Drug use: Never   Sexual activity: Not on file  Other Topics Concern   Not on file  Social History Narrative   Not on file   Social Determinants of Health   Financial Resource Strain: Not on file  Food Insecurity: Not on file  Transportation Needs: Not on file  Physical Activity: Not on file  Stress: Not on file  Social Connections: Not on file   Allergies  Allergen Reactions   Oxycodone Nausea And Vomiting    Patient states she cannot tolerate any narcotics   Oxycodone Hcl Nausea And Vomiting   Tramadol Hcl Nausea And Vomiting   Sulfa Antibiotics Itching and Rash   No family history on file.   Current Outpatient Medications (Cardiovascular):    rosuvastatin (CRESTOR) 20 MG tablet, Take  20 mg by mouth daily.  Current Outpatient Medications (Respiratory):    loratadine (CLARITIN) 10 MG tablet, Take 10 mg by mouth daily as needed.   Current Outpatient Medications (Hematological):    cyanocobalamin (VITAMIN B12) 1000 MCG tablet, Take 1,000 mcg by mouth daily.  Current Outpatient Medications (Other):    calcium carbonate (OS-CAL - DOSED IN MG OF ELEMENTAL CALCIUM) 1250 (500 Ca) MG tablet, Take 1 tablet by mouth daily with breakfast.   DULoxetine (CYMBALTA) 30 MG capsule, Take 1 capsule (30 mg total) by mouth daily.   Omega-3 Fatty Acids (FISH OIL) 1000 MG CPDR, Take by mouth.   Reviewed prior external information including notes and imaging from  primary care provider As well as notes  that were available from care everywhere and other healthcare systems.  Past medical history, social, surgical and family history all reviewed in electronic medical record.  No pertanent information unless stated regarding to the chief complaint.   Review of Systems:  No headache, visual changes, nausea, vomiting, diarrhea, constipation, dizziness, abdominal pain, skin rash, fevers, chills, night sweats, weight loss, swollen lymph nodes, body aches, joint swelling, chest pain, shortness of breath, mood changes. POSITIVE muscle aches  Objective  Blood pressure (!) 116/90, pulse 84, height  (1.575 m), weight 135 lb (61.2 kg), SpO2 98 %.   General: No apparent distress alert and oriented x3 mood and affect normal, dressed appropriately.  HEENT: Pupils equal, extraocular movements intact  Respiratory: Patient's speak in full sentences and does not appear short of breath  Cardiovascular: No lower extremity edema, non tender, no erythema  Sitting relatively comfortable but still has positive worsening pain with straight leg test of the legs bilaterally.  Patient has a negative difficulty though flexion of the knees.  Patient is still painful to palpation to even light palpation of the legs.    Impression and Recommendations:    The above documentation has been reviewed and is accurate and complete Judi Saa, DO

## 2022-08-10 ENCOUNTER — Ambulatory Visit (INDEPENDENT_AMBULATORY_CARE_PROVIDER_SITE_OTHER): Payer: HMO | Admitting: Family Medicine

## 2022-08-10 VITALS — BP 116/90 | HR 84 | Ht 62.0 in | Wt 135.0 lb

## 2022-08-10 DIAGNOSIS — M545 Low back pain, unspecified: Secondary | ICD-10-CM | POA: Diagnosis not present

## 2022-08-10 DIAGNOSIS — M2550 Pain in unspecified joint: Secondary | ICD-10-CM | POA: Diagnosis not present

## 2022-08-10 DIAGNOSIS — R5383 Other fatigue: Secondary | ICD-10-CM | POA: Diagnosis not present

## 2022-08-10 LAB — IBC PANEL
Iron: 85 ug/dL (ref 42–145)
Saturation Ratios: 26.9 % (ref 20.0–50.0)
TIBC: 316.4 ug/dL (ref 250.0–450.0)
Transferrin: 226 mg/dL (ref 212.0–360.0)

## 2022-08-10 LAB — CBC WITH DIFFERENTIAL/PLATELET
Basophils Absolute: 0 10*3/uL (ref 0.0–0.1)
Basophils Relative: 0.7 % (ref 0.0–3.0)
Eosinophils Absolute: 0.1 10*3/uL (ref 0.0–0.7)
Eosinophils Relative: 1.2 % (ref 0.0–5.0)
HCT: 40.2 % (ref 36.0–46.0)
Hemoglobin: 13.3 g/dL (ref 12.0–15.0)
Lymphocytes Relative: 39 % (ref 12.0–46.0)
Lymphs Abs: 1.9 10*3/uL (ref 0.7–4.0)
MCHC: 33.2 g/dL (ref 30.0–36.0)
MCV: 87.8 fl (ref 78.0–100.0)
Monocytes Absolute: 0.4 10*3/uL (ref 0.1–1.0)
Monocytes Relative: 8.2 % (ref 3.0–12.0)
Neutro Abs: 2.4 10*3/uL (ref 1.4–7.7)
Neutrophils Relative %: 50.9 % (ref 43.0–77.0)
Platelets: 262 10*3/uL (ref 150.0–400.0)
RBC: 4.57 Mil/uL (ref 3.87–5.11)
RDW: 14.5 % (ref 11.5–15.5)
WBC: 4.8 10*3/uL (ref 4.0–10.5)

## 2022-08-10 LAB — COMPREHENSIVE METABOLIC PANEL
ALT: 21 U/L (ref 0–35)
AST: 20 U/L (ref 0–37)
Albumin: 4.5 g/dL (ref 3.5–5.2)
Alkaline Phosphatase: 41 U/L (ref 39–117)
BUN: 16 mg/dL (ref 6–23)
CO2: 28 mEq/L (ref 19–32)
Calcium: 9.5 mg/dL (ref 8.4–10.5)
Chloride: 102 mEq/L (ref 96–112)
Creatinine, Ser: 0.77 mg/dL (ref 0.40–1.20)
GFR: 77.72 mL/min (ref >60.00)
Glucose, Bld: 93 mg/dL (ref 70–99)
Potassium: 4.2 mEq/L (ref 3.5–5.1)
Sodium: 136 mEq/L (ref 135–145)
Total Bilirubin: 0.3 mg/dL (ref 0.2–1.2)
Total Protein: 7.4 g/dL (ref 6.0–8.3)

## 2022-08-10 LAB — TSH: TSH: 1.62 u[IU]/mL (ref 0.35–5.50)

## 2022-08-10 LAB — FERRITIN: Ferritin: 55 ng/mL (ref 10.0–291.0)

## 2022-08-10 LAB — C-REACTIVE PROTEIN: CRP: <1 mg/dL (ref 0.5–20.0)

## 2022-08-10 LAB — URIC ACID: Uric Acid, Serum: 3.4 mg/dL (ref 2.4–7.0)

## 2022-08-10 LAB — VITAMIN B12: Vitamin B-12: 933 pg/mL — ABNORMAL HIGH (ref 211–911)

## 2022-08-10 LAB — SEDIMENTATION RATE: Sed Rate: 16 mm/hr (ref 0–30)

## 2022-08-10 LAB — VITAMIN D 25 HYDROXY (VIT D DEFICIENCY, FRACTURES): VITD: 41.34 ng/mL (ref 30.00–100.00)

## 2022-08-10 MED ORDER — DULOXETINE HCL 30 MG PO CPEP: 30.0000 mg | ORAL_CAPSULE | Freq: Every day | ORAL | 0 refills | Status: DC

## 2022-08-10 NOTE — Patient Instructions (Signed)
Labs today Cymbalta   Send message in 2 weeks See me in 6-8 weeks

## 2022-08-10 NOTE — Assessment & Plan Note (Signed)
Patient is having leg pain and low back pain.  Still think it is secondary to more radicular symptoms.  We discussed with patient at great length.  Does have a strong family history now she states of sacroiliitis.  Will get laboratory workup.  ABIs were abnormal but vascularity did not change any medical management.  At this point we discussed which activities to do and which ones to avoid otherwise.  Increase activity slowly.  Follow-up with me again in 6 to 8 weeks.  Total time with patient today reviewing patient's previous imaging and discussing with patient 31 minutes.  Increase Cymbalta to 30 mg with patient having some response to the 20 mg.  Has had difficulty with different medications over the time including gabapentin

## 2022-08-12 LAB — RHEUMATOID FACTOR: Rheumatoid fact SerPl-aCnc: <10 IU/mL (ref <14)

## 2022-08-12 LAB — HLA-B27 ANTIGEN: HLA-B27 Antigen: NEGATIVE

## 2022-08-12 LAB — PTH, INTACT AND CALCIUM
Calcium: 9.4 mg/dL (ref 8.6–10.4)
PTH: 23 pg/mL (ref 16–77)

## 2022-08-12 LAB — ANA: Anti Nuclear Antibody (ANA): NEGATIVE

## 2022-08-12 LAB — CALCIUM, IONIZED: Calcium, Ion: 5 mg/dL (ref 4.7–5.5)

## 2022-08-12 LAB — ANGIOTENSIN CONVERTING ENZYME: Angiotensin-Converting Enzyme: 28 U/L (ref 9–67)

## 2022-08-12 LAB — CYCLIC CITRUL PEPTIDE ANTIBODY, IGG: Cyclic Citrullin Peptide Ab: <16 UNITS

## 2022-08-16 ENCOUNTER — Encounter: Payer: Self-pay | Admitting: Family Medicine

## 2022-08-20 ENCOUNTER — Encounter: Payer: Self-pay | Admitting: Family Medicine

## 2022-08-24 ENCOUNTER — Other Ambulatory Visit: Payer: Self-pay

## 2022-08-24 MED ORDER — MELOXICAM 7.5 MG PO TABS: 7.5000 mg | ORAL_TABLET | Freq: Every day | ORAL | 0 refills | Status: DC

## 2022-09-05 ENCOUNTER — Other Ambulatory Visit: Payer: Self-pay | Admitting: Family Medicine

## 2022-09-15 ENCOUNTER — Other Ambulatory Visit: Payer: Self-pay | Admitting: Family Medicine

## 2022-09-21 NOTE — Progress Notes (Unsigned)
[facility] [address] [place] [address] Phone: [phone] Subjective:   Bruce Donath, am serving as a scribe for Dr. Antoine Primas.  I'm seeing this patient by the request  of:  Farris Has, MD  CC: back pain follow up   [id number]  08/10/2022 Patient is having leg pain and low back pain. Still think it is secondary to more radicular symptoms. We discussed with patient at great length. Does have a strong family history now she states of sacroiliitis. Will get laboratory workup. ABIs were abnormal but vascularity did not change any medical management. At this point we discussed which activities to do and which ones to avoid otherwise. Increase activity slowly. Follow-up with me again in 6 to 8 weeks. Total time with patient today reviewing patient's previous imaging and discussing with patient 31 minutes. Increase Cymbalta to 30 mg with patient having some response to the 20 mg. Has had difficulty with different medications over the time including gabapentin   Updated 09/22/2022 ROSE BEAULIEU is a 72 y.o. female coming in with complaint of back pain, increased cymbalta to 30 mg last visit patient unfortunately was unable to sleep and went back down to 20 mg.  Also restarted meloxicam 7.5 mg.  Laboratory workup was unremarkable. Patient states that her back pain is not improving and nothing is working. Unable to walk 18 holes of golf. Walks 9 and rides 9. Pain seems to worsen when seated for prolonged periods.    X-rays previously does show degenerative scoliosis of the back with diffuse severe facet degenerative disc disease of the low back.    Past Medical History:  Diagnosis Date   Allergy    Arthritis    Hyperlipidemia    Past Surgical History:  Procedure Laterality Date   CESAREAN SECTION     2 times   KNEE SURGERY Left 2022   Social History   Socioeconomic History   Marital status: Married    Spouse name: Not on file    Number of children: Not on file   Years of education: Not on file   Highest education level: Not on file  Occupational History   Not on file  Tobacco Use   Smoking status: Never   Smokeless tobacco: Never  Substance and Sexual Activity   Alcohol use: Yes    Comment: occasional   Drug use: Never   Sexual activity: Not on file  Other Topics Concern   Not on file  Social History Narrative   Not on file   Social Determinants of Health   Financial Resource Strain: Not on file  Food Insecurity: Not on file  Transportation Needs: Not on file  Physical Activity: Not on file  Stress: Not on file  Social Connections: Not on file   Allergies  Allergen Reactions   Oxycodone Nausea And Vomiting    Patient states she cannot tolerate any narcotics   Oxycodone Hcl Nausea And Vomiting   Tramadol Hcl Nausea And Vomiting   Sulfa Antibiotics Itching and Rash   No family history on file.   Current Outpatient Medications (Cardiovascular):    rosuvastatin (CRESTOR) 20 MG tablet, Take 20 mg by mouth daily.  Current Outpatient Medications (Respiratory):    loratadine (CLARITIN) 10 MG tablet, Take 10 mg by mouth daily as needed.  Current Outpatient Medications (Analgesics):    meloxicam (MOBIC) 7.5 MG tablet, Take 1 tablet (7.5 mg total) by mouth daily.   Current Outpatient Medications (Other):  calcium carbonate (OS-CAL - DOSED IN MG OF ELEMENTAL CALCIUM) 1250 (500 Ca) MG tablet, Take 1 tablet by mouth daily with breakfast.   Omega-3 Fatty Acids (FISH OIL) 1000 MG CPDR, Take by mouth.   Reviewed prior external information including notes and imaging from  primary care provider As well as notes that were available from care everywhere and other healthcare systems.  Past medical history, social, surgical and family history all reviewed in electronic medical record.  No pertanent information unless stated regarding to the chief complaint.   Review of Systems:  No headache, visual  changes, nausea, vomiting, diarrhea, constipation, dizziness, abdominal pain, skin rash, fevers, chills, night sweats, weight loss, swollen lymph nodes, joint swelling, chest pain, shortness of breath, mood changes. POSITIVE muscle aches, body aches  Objective  Blood pressure 122/82, pulse 89, height 5\' 2"  (1.575 m), weight 135 lb (61.2 kg), SpO2 98 %.   General: No apparent distress alert and oriented x3 mood and affect normal, dressed appropriately.  HEENT: Pupils equal, extraocular movements intact  Respiratory: Patient's speak in full sentences and does not appear short of breath  Cardiovascular: No lower extremity edema, non tender, no erythema  Back exam shows low back does have significant loss of lordosis noted.  Patient does have some difficulty with extension as well. Tightness noted with straight leg test on the left side.    Impression and Recommendations:     The above documentation has been reviewed and is accurate and complete Judi Saa, DO

## 2022-09-22 ENCOUNTER — Encounter: Payer: Self-pay | Admitting: Family Medicine

## 2022-09-22 ENCOUNTER — Ambulatory Visit (INDEPENDENT_AMBULATORY_CARE_PROVIDER_SITE_OTHER): Payer: HMO | Admitting: Family Medicine

## 2022-09-22 VITALS — BP 122/82 | HR 89 | Ht 62.0 in | Wt 135.0 lb

## 2022-09-22 DIAGNOSIS — M545 Low back pain, unspecified: Secondary | ICD-10-CM | POA: Diagnosis not present

## 2022-09-22 NOTE — Patient Instructions (Addendum)
MRI lumbar [phone] °We will be in touch °

## 2022-09-22 NOTE — Assessment & Plan Note (Signed)
Low back pain this is worsening at this point.  Very concerned with the spinal stenosis.  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  Patient is unable to do daily activities and unable to do anything more than sit even for a certain amount of time.  Waking her up at night, avoiding certain activities as well.  Has failed multiple different medications including anti-inflammatories, Tylenol, Cymbalta, formal physical therapy she has failed as well.  At this point I do feel advanced imaging is warranted and could be a candidate for possible epidurals or facet injections.  Depending on findings we will discuss further.

## 2022-09-23 ENCOUNTER — Ambulatory Visit
Admission: RE | Admit: 2022-09-23 | Discharge: 2022-09-23 | Disposition: A | Discharge status: Home / Self Care | Payer: HMO | Source: Ambulatory Visit | Attending: Family Medicine | Admitting: Family Medicine

## 2022-09-23 DIAGNOSIS — M545 Low back pain, unspecified: Secondary | ICD-10-CM

## 2022-09-23 DIAGNOSIS — M48061 Spinal stenosis, lumbar region without neurogenic claudication: Secondary | ICD-10-CM | POA: Diagnosis not present

## 2022-09-23 DIAGNOSIS — M419 Scoliosis, unspecified: Secondary | ICD-10-CM | POA: Diagnosis not present

## 2022-09-30 ENCOUNTER — Encounter: Payer: Self-pay | Admitting: Family Medicine

## 2022-10-07 ENCOUNTER — Encounter: Payer: Self-pay | Admitting: Family Medicine

## 2022-10-08 ENCOUNTER — Other Ambulatory Visit: Payer: Self-pay

## 2022-10-08 ENCOUNTER — Other Ambulatory Visit: Payer: Self-pay | Admitting: Family Medicine

## 2022-10-08 DIAGNOSIS — M47816 Spondylosis without myelopathy or radiculopathy, lumbar region: Secondary | ICD-10-CM

## 2022-10-22 ENCOUNTER — Ambulatory Visit
Admission: RE | Admit: 2022-10-22 | Discharge: 2022-10-22 | Disposition: A | Discharge status: Home / Self Care | Payer: HMO | Source: Ambulatory Visit | Attending: Family Medicine | Admitting: Family Medicine

## 2022-10-22 ENCOUNTER — Other Ambulatory Visit: Payer: Self-pay | Admitting: Family Medicine

## 2022-10-22 DIAGNOSIS — M47816 Spondylosis without myelopathy or radiculopathy, lumbar region: Secondary | ICD-10-CM

## 2022-10-22 DIAGNOSIS — M4727 Other spondylosis with radiculopathy, lumbosacral region: Secondary | ICD-10-CM | POA: Diagnosis not present

## 2022-10-22 MED ORDER — IOPAMIDOL (ISOVUE-M 200) INJECTION 41%
1.0000 mL | Freq: Once | INTRAMUSCULAR | Status: AC
  Administered 2022-10-22: 1 mL via EPIDURAL

## 2022-10-22 MED ORDER — METHYLPREDNISOLONE ACETATE 40 MG/ML INJ SUSP (RADIOLOG
80.0000 mg | Freq: Once | INTRAMUSCULAR | Status: AC
  Administered 2022-10-22: 80 mg via EPIDURAL

## 2022-10-22 NOTE — Discharge Instructions (Addendum)

## 2022-10-27 ENCOUNTER — Encounter: Payer: Self-pay | Admitting: Family Medicine

## 2022-11-08 DIAGNOSIS — M25562 Pain in left knee: Secondary | ICD-10-CM | POA: Diagnosis not present

## 2022-11-29 ENCOUNTER — Encounter: Payer: Self-pay | Admitting: Family Medicine

## 2022-11-30 ENCOUNTER — Other Ambulatory Visit: Payer: Self-pay | Admitting: Family Medicine

## 2022-11-30 DIAGNOSIS — M545 Low back pain, unspecified: Secondary | ICD-10-CM

## 2022-12-02 ENCOUNTER — Ambulatory Visit: Payer: HMO | Admitting: Family Medicine

## 2022-12-06 ENCOUNTER — Ambulatory Visit
Admission: RE | Admit: 2022-12-06 | Discharge: 2022-12-06 | Disposition: A | Discharge status: Home / Self Care | Payer: HMO | Source: Ambulatory Visit | Attending: Family Medicine | Admitting: Family Medicine

## 2022-12-06 DIAGNOSIS — E349 Endocrine disorder, unspecified: Secondary | ICD-10-CM | POA: Diagnosis not present

## 2022-12-06 DIAGNOSIS — N958 Other specified menopausal and perimenopausal disorders: Secondary | ICD-10-CM | POA: Diagnosis not present

## 2022-12-06 DIAGNOSIS — M8588 Other specified disorders of bone density and structure, other site: Secondary | ICD-10-CM | POA: Diagnosis not present

## 2022-12-06 DIAGNOSIS — M8580 Other specified disorders of bone density and structure, unspecified site: Secondary | ICD-10-CM

## 2022-12-08 DIAGNOSIS — L259 Unspecified contact dermatitis, unspecified cause: Secondary | ICD-10-CM | POA: Diagnosis not present

## 2022-12-08 DIAGNOSIS — L814 Other melanin hyperpigmentation: Secondary | ICD-10-CM | POA: Diagnosis not present

## 2022-12-08 DIAGNOSIS — B351 Tinea unguium: Secondary | ICD-10-CM | POA: Diagnosis not present

## 2022-12-08 DIAGNOSIS — Z85828 Personal history of other malignant neoplasm of skin: Secondary | ICD-10-CM | POA: Diagnosis not present

## 2022-12-08 DIAGNOSIS — Z08 Encounter for follow-up examination after completed treatment for malignant neoplasm: Secondary | ICD-10-CM | POA: Diagnosis not present

## 2022-12-08 DIAGNOSIS — L309 Dermatitis, unspecified: Secondary | ICD-10-CM | POA: Diagnosis not present

## 2022-12-08 DIAGNOSIS — D2271 Melanocytic nevi of right lower limb, including hip: Secondary | ICD-10-CM | POA: Diagnosis not present

## 2022-12-08 DIAGNOSIS — L821 Other seborrheic keratosis: Secondary | ICD-10-CM | POA: Diagnosis not present

## 2022-12-08 DIAGNOSIS — D485 Neoplasm of uncertain behavior of skin: Secondary | ICD-10-CM | POA: Diagnosis not present

## 2022-12-16 NOTE — Progress Notes (Signed)
[facility] [address] [place] [address] Phone: [phone] Subjective:   INadine Counts, am serving as a scribe for Dr. Antoine Primas.  I'm seeing this patient by the request  of:  Farris Has, MD  CC: Low back pain follow-up  [id number]  Rachel Fisher is a 72 y.o. female coming in with complaint of low back pain.  Did give an MRI in June 2024.  MRI did show significant arthritic changes with spinal and foraminal stenosis.  Seem to be worse throughout the lumbar spine.  Due to patient's symptoms attempted an epidural at L4-L5 on October 22, 2022.  Patient states that had significant improvement for weeks and then started to worsen again. Did very well after epidural for 2-3 weeks. Then got progressively worse.    Osteoporotic bone density shows that patient is osteopenic with a -1.1  Past Medical History:  Diagnosis Date   Allergy    Arthritis    Hyperlipidemia    Past Surgical History:  Procedure Laterality Date   CESAREAN SECTION     2 times   KNEE SURGERY Left 2022   Social History   Socioeconomic History   Marital status: Married    Spouse name: Not on file   Number of children: Not on file   Years of education: Not on file   Highest education level: Not on file  Occupational History   Not on file  Tobacco Use   Smoking status: Never   Smokeless tobacco: Never  Substance and Sexual Activity   Alcohol use: Yes    Comment: occasional   Drug use: Never   Sexual activity: Not on file  Other Topics Concern   Not on file  Social History Narrative   Not on file   Social Determinants of Health   Financial Resource Strain: Not on file  Food Insecurity: Not on file  Transportation Needs: Not on file  Physical Activity: Not on file  Stress: Not on file  Social Connections: Not on file   Allergies  Allergen Reactions   Oxycodone Nausea And Vomiting    Patient states she cannot tolerate any narcotics    Oxycodone Hcl Nausea And Vomiting   Tramadol Hcl Nausea And Vomiting   Sulfa Antibiotics Itching and Rash   No family history on file.   Current Outpatient Medications (Cardiovascular):    rosuvastatin (CRESTOR) 20 MG tablet, Take 20 mg by mouth daily.  Current Outpatient Medications (Respiratory):    loratadine (CLARITIN) 10 MG tablet, Take 10 mg by mouth daily as needed.  Current Outpatient Medications (Analgesics):    meloxicam (MOBIC) 7.5 MG tablet, Take 1 tablet (7.5 mg total) by mouth daily.   Current Outpatient Medications (Other):    calcium carbonate (OS-CAL - DOSED IN MG OF ELEMENTAL CALCIUM) 1250 (500 Ca) MG tablet, Take 1 tablet by mouth daily with breakfast.   Omega-3 Fatty Acids (FISH OIL) 1000 MG CPDR, Take by mouth.   Reviewed prior external information including notes and imaging from  primary care provider As well as notes that were available from care everywhere and other healthcare systems.    Review of Systems:  No headache, visual changes, nausea, vomiting, diarrhea, constipation, dizziness, abdominal pain, skin rash, fevers, chills, night sweats, weight loss, swollen lymph nodes, joint swelling, chest pain, shortness of breath, mood changes. POSITIVE muscle aches, body aches  Objective  Blood pressure 124/84, pulse 83, height 5\' 2"  (1.575 m), weight 134 lb (60.8 kg),  SpO2 99%.   General: No apparent distress alert and oriented x3 mood and affect normal, dressed appropriately.  HEENT: Pupils equal, extraocular movements intact  Respiratory: Patient's speak in full sentences and does not appear short of breath  Cardiovascular: No lower extremity edema, non tender, no erythema  Patient low back does have some loss lordosis noted.  Sitting comfortably in the chair at the moment.    Impression and Recommendations:    The above documentation has been reviewed and is accurate and complete Judi Saa, DO

## 2022-12-17 ENCOUNTER — Encounter: Payer: Self-pay | Admitting: Family Medicine

## 2022-12-17 ENCOUNTER — Ambulatory Visit: Payer: HMO | Admitting: Family Medicine

## 2022-12-17 VITALS — BP 124/84 | HR 83 | Ht 62.0 in | Wt 134.0 lb

## 2022-12-17 DIAGNOSIS — M545 Low back pain, unspecified: Secondary | ICD-10-CM | POA: Diagnosis not present

## 2022-12-17 NOTE — Assessment & Plan Note (Signed)
Known significant spinal stenosis.  Was 100% better with the epidural previously.  This is at the L4-L5 area.  Would like to do this again and hopefully patient will make significant improvement.  Discussed with patient and see if this continues to improve this would be a good thing and can do up to 3 in 6 to 8-week intervals based on interventional radiologist preference.  We discussed though if this does fail that then with her needing 100% better ago with the initial 100 would need to consider the possibility of referral to a neurosurgeon.  Patient wants to try to avoid that if possible.  Follow-up with me again 8 weeks after the next injection in May discussed with patient through MyChart otherwise.

## 2022-12-17 NOTE — Patient Instructions (Addendum)
Get epidural next week Can consider another one 6-8 weeks after that one Send message 2-4 weeks after you have epidural to update [place]

## 2022-12-23 NOTE — Discharge Instructions (Signed)

## 2022-12-24 ENCOUNTER — Ambulatory Visit
Admission: RE | Admit: 2022-12-24 | Discharge: 2022-12-24 | Disposition: A | Discharge status: Home / Self Care | Payer: HMO | Source: Ambulatory Visit | Attending: Family Medicine | Admitting: Family Medicine

## 2022-12-24 DIAGNOSIS — M4727 Other spondylosis with radiculopathy, lumbosacral region: Secondary | ICD-10-CM | POA: Diagnosis not present

## 2022-12-24 DIAGNOSIS — M545 Low back pain, unspecified: Secondary | ICD-10-CM

## 2022-12-24 MED ORDER — IOPAMIDOL (ISOVUE-M 200) INJECTION 41%
1.0000 mL | Freq: Once | INTRAMUSCULAR | Status: AC
  Administered 2022-12-24: 1 mL via EPIDURAL

## 2022-12-24 MED ORDER — METHYLPREDNISOLONE ACETATE 40 MG/ML INJ SUSP (RADIOLOG
80.0000 mg | Freq: Once | INTRAMUSCULAR | Status: AC
  Administered 2022-12-24: 80 mg via EPIDURAL

## 2022-12-29 DIAGNOSIS — L570 Actinic keratosis: Secondary | ICD-10-CM | POA: Diagnosis not present

## 2022-12-29 DIAGNOSIS — L2089 Other atopic dermatitis: Secondary | ICD-10-CM | POA: Diagnosis not present

## 2023-01-06 DIAGNOSIS — Z1231 Encounter for screening mammogram for malignant neoplasm of breast: Secondary | ICD-10-CM | POA: Diagnosis not present

## 2023-01-25 ENCOUNTER — Other Ambulatory Visit: Payer: Self-pay

## 2023-01-25 ENCOUNTER — Encounter: Payer: Self-pay | Admitting: Family Medicine

## 2023-01-25 DIAGNOSIS — M5416 Radiculopathy, lumbar region: Secondary | ICD-10-CM

## 2023-02-08 NOTE — Discharge Instructions (Signed)

## 2023-02-09 ENCOUNTER — Ambulatory Visit
Admission: RE | Admit: 2023-02-09 | Discharge: 2023-02-09 | Disposition: A | Discharge status: Home / Self Care | Payer: HMO | Source: Ambulatory Visit | Attending: Family Medicine | Admitting: Family Medicine

## 2023-02-09 DIAGNOSIS — M4727 Other spondylosis with radiculopathy, lumbosacral region: Secondary | ICD-10-CM | POA: Diagnosis not present

## 2023-02-09 DIAGNOSIS — M5416 Radiculopathy, lumbar region: Secondary | ICD-10-CM

## 2023-02-09 MED ORDER — METHYLPREDNISOLONE ACETATE 40 MG/ML INJ SUSP (RADIOLOG
80.0000 mg | Freq: Once | INTRAMUSCULAR | Status: AC
  Administered 2023-02-09: 80 mg via EPIDURAL

## 2023-02-09 MED ORDER — IOPAMIDOL (ISOVUE-M 200) INJECTION 41%
1.0000 mL | Freq: Once | INTRAMUSCULAR | Status: AC
  Administered 2023-02-09: 1 mL via EPIDURAL

## 2023-02-10 DIAGNOSIS — M1711 Unilateral primary osteoarthritis, right knee: Secondary | ICD-10-CM | POA: Diagnosis not present

## 2023-03-02 ENCOUNTER — Encounter: Payer: Self-pay | Admitting: Family Medicine

## 2023-03-17 DIAGNOSIS — H353131 Nonexudative age-related macular degeneration, bilateral, early dry stage: Secondary | ICD-10-CM | POA: Diagnosis not present

## 2023-03-17 DIAGNOSIS — D3132 Benign neoplasm of left choroid: Secondary | ICD-10-CM | POA: Diagnosis not present

## 2023-03-17 DIAGNOSIS — H43813 Vitreous degeneration, bilateral: Secondary | ICD-10-CM | POA: Diagnosis not present

## 2023-03-17 DIAGNOSIS — H35372 Puckering of macula, left eye: Secondary | ICD-10-CM | POA: Diagnosis not present

## 2023-04-07 NOTE — Progress Notes (Signed)
Rachel [facility] [address] [place] [address] Phone: [phone] Subjective:   Rachel Fisher, am serving as a scribe for Dr. Antoine Fisher. I'm seeing this patient by the request  of:  Rachel Has, MD  CC: Back pain and leg pain with worsening  [id number]  12/17/2022 Known significant spinal stenosis.  Was 100% better with the epidural previously.  This is at the L4-L5 area.  Would like to do this again and hopefully patient will make significant improvement.  Discussed with patient and see if this continues to improve this would be a good thing and can do up to 3 in 6 to 8-week intervals based on interventional radiologist preference.  We discussed though if this does fail that then with her needing 100% better ago with the initial 100 would need to consider the possibility of referral to a neurosurgeon.  Patient wants to try to avoid that if possible.  Follow-up with me again 8 weeks after the next injection in May discussed with patient through MyChart otherwise.   Update 04/08/2023 Rachel Fisher is a 72 y.o. female coming in with complaint of lumbar spine pain. Epidural 02/09/2023. Patient states that epidural did not help at all. Pain radiating down into both legs.       Past Medical History:  Diagnosis Date   Allergy    Arthritis    Hyperlipidemia    Past Surgical History:  Procedure Laterality Date   CESAREAN SECTION     2 times   KNEE SURGERY Left 2022   Social History   Socioeconomic History   Marital status: Married    Spouse name: Not on file   Number of children: Not on file   Years of education: Not on file   Highest education level: Not on file  Occupational History   Not on file  Tobacco Use   Smoking status: Never   Smokeless tobacco: Never  Substance and Sexual Activity   Alcohol use: Yes    Comment: occasional   Drug use: Never   Sexual activity: Not on file  Other Topics Concern   Not on file   Social History Narrative   Not on file   Social Drivers of Health   Financial Resource Strain: Not on file  Food Insecurity: Not on file  Transportation Needs: Not on file  Physical Activity: Not on file  Stress: Not on file  Social Connections: Not on file   Allergies  Allergen Reactions   Oxycodone Nausea And Vomiting    Patient states she cannot tolerate any narcotics   Oxycodone Hcl Nausea And Vomiting   Tramadol Hcl Nausea And Vomiting   Sulfa Antibiotics Itching and Rash   History reviewed. No pertinent family history.   Current Outpatient Medications (Cardiovascular):    rosuvastatin (CRESTOR) 20 MG tablet, Take 20 mg by mouth daily.  Current Outpatient Medications (Respiratory):    loratadine (CLARITIN) 10 MG tablet, Take 10 mg by mouth daily as needed.    Current Outpatient Medications (Other):    calcium carbonate (OS-CAL - DOSED IN MG OF ELEMENTAL CALCIUM) 1250 (500 Ca) MG tablet, Take 1 tablet by mouth daily with breakfast.   Multiple Vitamins-Minerals (PRESERVISION AREDS 2 PO), Take by mouth.   Reviewed prior external information including notes and imaging from  primary care provider As well as notes that were available from care everywhere and other healthcare systems.  Past medical history, social, surgical and family history all reviewed in  electronic medical record.  No pertanent information unless stated regarding to the chief complaint.   Review of Systems:  No headache, visual changes, nausea, vomiting, diarrhea, constipation, dizziness, abdominal pain, skin rash, fevers, chills, night sweats, weight loss, swollen lymph nodes,  joint swelling, chest pain, shortness of breath, mood changes. POSITIVE muscle aches worsening body aches  Objective  Blood pressure 128/88, pulse 81, height 5\' 2"  (1.575 m), weight 137 lb (62.1 kg), SpO2 96%.   General: No apparent distress alert and oriented x3 mood and affect normal, dressed appropriately.  HEENT:  Pupils equal, extraocular movements intact  Respiratory: Patient's speak in full sentences and does not appear short of breath  Cardiovascular: No lower extremity edema, non tender, no erythema  Patient is sitting comfortably.  Deferred exam today.    Impression and Recommendations:     The above documentation Fisher been reviewed and is accurate and complete Judi Saa, DO

## 2023-04-08 ENCOUNTER — Ambulatory Visit (INDEPENDENT_AMBULATORY_CARE_PROVIDER_SITE_OTHER): Payer: HMO | Admitting: Family Medicine

## 2023-04-08 ENCOUNTER — Encounter: Payer: Self-pay | Admitting: Family Medicine

## 2023-04-08 VITALS — BP 128/88 | HR 81 | Ht 62.0 in | Wt 137.0 lb

## 2023-04-08 DIAGNOSIS — M545 Low back pain, unspecified: Secondary | ICD-10-CM

## 2023-04-08 NOTE — Assessment & Plan Note (Signed)
Patient does have severe spinal stenosis noted on the MRI.  Patient initially did respond to 100% for weeks with the first L4-L5 but unfortunately has had varying to no improvement now noted with 2 more epidurals.  The pain in the back is continuing to affect daily activities.  Patient is unable to do things she enjoys and has even difficulty with daily activities.  Continues to have pain that wakes her up also at night with any type of extension of her back.  Not responding well to the other medications.  Has failed physical therapy.  Will send patient to neurosurgery to discuss further treatment options including surgical intervention.  Total time discussed with patient as well as reviewing imaging and previous notes 32 minutes

## 2023-04-08 NOTE — Patient Instructions (Signed)
[facility] is next step  Will improve quality of life Happy Holidays We are here if you have any questions

## 2023-04-15 ENCOUNTER — Encounter: Payer: Self-pay | Admitting: Family Medicine

## 2023-04-29 DIAGNOSIS — M48061 Spinal stenosis, lumbar region without neurogenic claudication: Secondary | ICD-10-CM | POA: Diagnosis not present

## 2023-04-29 DIAGNOSIS — Z6825 Body mass index (BMI) 25.0-25.9, adult: Secondary | ICD-10-CM | POA: Diagnosis not present

## 2023-04-29 DIAGNOSIS — M5416 Radiculopathy, lumbar region: Secondary | ICD-10-CM | POA: Diagnosis not present

## 2023-07-18 DIAGNOSIS — E785 Hyperlipidemia, unspecified: Secondary | ICD-10-CM | POA: Diagnosis not present

## 2023-08-17 DIAGNOSIS — D2271 Melanocytic nevi of right lower limb, including hip: Secondary | ICD-10-CM | POA: Diagnosis not present

## 2023-08-17 DIAGNOSIS — L570 Actinic keratosis: Secondary | ICD-10-CM | POA: Diagnosis not present

## 2023-08-17 DIAGNOSIS — L309 Dermatitis, unspecified: Secondary | ICD-10-CM | POA: Diagnosis not present

## 2023-08-17 DIAGNOSIS — L814 Other melanin hyperpigmentation: Secondary | ICD-10-CM | POA: Diagnosis not present

## 2023-08-17 DIAGNOSIS — L821 Other seborrheic keratosis: Secondary | ICD-10-CM | POA: Diagnosis not present

## 2023-08-17 DIAGNOSIS — Z08 Encounter for follow-up examination after completed treatment for malignant neoplasm: Secondary | ICD-10-CM | POA: Diagnosis not present

## 2023-08-17 DIAGNOSIS — Z85828 Personal history of other malignant neoplasm of skin: Secondary | ICD-10-CM | POA: Diagnosis not present

## 2023-08-18 DIAGNOSIS — M1711 Unilateral primary osteoarthritis, right knee: Secondary | ICD-10-CM | POA: Diagnosis not present

## 2023-08-25 ENCOUNTER — Ambulatory Visit: Admitting: Family Medicine

## 2023-09-13 DIAGNOSIS — M5416 Radiculopathy, lumbar region: Secondary | ICD-10-CM | POA: Diagnosis not present

## 2023-09-20 DIAGNOSIS — M1711 Unilateral primary osteoarthritis, right knee: Secondary | ICD-10-CM | POA: Diagnosis not present

## 2023-09-21 DIAGNOSIS — M5416 Radiculopathy, lumbar region: Secondary | ICD-10-CM | POA: Diagnosis not present

## 2023-09-23 DIAGNOSIS — M5416 Radiculopathy, lumbar region: Secondary | ICD-10-CM | POA: Diagnosis not present

## 2023-09-28 DIAGNOSIS — M5416 Radiculopathy, lumbar region: Secondary | ICD-10-CM | POA: Diagnosis not present

## 2023-09-29 DIAGNOSIS — M1711 Unilateral primary osteoarthritis, right knee: Secondary | ICD-10-CM | POA: Diagnosis not present

## 2023-09-30 DIAGNOSIS — M5416 Radiculopathy, lumbar region: Secondary | ICD-10-CM | POA: Diagnosis not present

## 2023-10-05 DIAGNOSIS — M5416 Radiculopathy, lumbar region: Secondary | ICD-10-CM | POA: Diagnosis not present

## 2023-10-12 DIAGNOSIS — M5416 Radiculopathy, lumbar region: Secondary | ICD-10-CM | POA: Diagnosis not present

## 2023-11-08 DIAGNOSIS — M1711 Unilateral primary osteoarthritis, right knee: Secondary | ICD-10-CM | POA: Diagnosis not present

## 2023-12-27 DIAGNOSIS — E785 Hyperlipidemia, unspecified: Secondary | ICD-10-CM | POA: Diagnosis not present

## 2023-12-27 DIAGNOSIS — M25569 Pain in unspecified knee: Secondary | ICD-10-CM | POA: Diagnosis not present

## 2023-12-27 DIAGNOSIS — Z01818 Encounter for other preprocedural examination: Secondary | ICD-10-CM | POA: Diagnosis not present

## 2024-01-17 DIAGNOSIS — Z1231 Encounter for screening mammogram for malignant neoplasm of breast: Secondary | ICD-10-CM | POA: Diagnosis not present

## 2024-01-19 DIAGNOSIS — M1711 Unilateral primary osteoarthritis, right knee: Secondary | ICD-10-CM | POA: Diagnosis not present

## 2024-01-20 DIAGNOSIS — R928 Other abnormal and inconclusive findings on diagnostic imaging of breast: Secondary | ICD-10-CM | POA: Diagnosis not present

## 2024-02-01 DIAGNOSIS — M1711 Unilateral primary osteoarthritis, right knee: Secondary | ICD-10-CM | POA: Diagnosis not present

## 2024-02-01 DIAGNOSIS — G8918 Other acute postprocedural pain: Secondary | ICD-10-CM | POA: Diagnosis not present

## 2024-02-01 DIAGNOSIS — M25761 Osteophyte, right knee: Secondary | ICD-10-CM | POA: Diagnosis not present

## 2024-02-03 DIAGNOSIS — M25661 Stiffness of right knee, not elsewhere classified: Secondary | ICD-10-CM | POA: Diagnosis not present

## 2024-02-03 DIAGNOSIS — Z96651 Presence of right artificial knee joint: Secondary | ICD-10-CM | POA: Diagnosis not present

## 2024-02-06 DIAGNOSIS — Z96651 Presence of right artificial knee joint: Secondary | ICD-10-CM | POA: Diagnosis not present

## 2024-02-06 DIAGNOSIS — M25661 Stiffness of right knee, not elsewhere classified: Secondary | ICD-10-CM | POA: Diagnosis not present

## 2024-02-08 DIAGNOSIS — Z96651 Presence of right artificial knee joint: Secondary | ICD-10-CM | POA: Diagnosis not present

## 2024-02-08 DIAGNOSIS — M25661 Stiffness of right knee, not elsewhere classified: Secondary | ICD-10-CM | POA: Diagnosis not present

## 2024-02-10 DIAGNOSIS — M25661 Stiffness of right knee, not elsewhere classified: Secondary | ICD-10-CM | POA: Diagnosis not present

## 2024-02-10 DIAGNOSIS — Z96651 Presence of right artificial knee joint: Secondary | ICD-10-CM | POA: Diagnosis not present

## 2024-02-13 DIAGNOSIS — Z4889 Encounter for other specified surgical aftercare: Secondary | ICD-10-CM | POA: Diagnosis not present

## 2024-02-13 DIAGNOSIS — M25661 Stiffness of right knee, not elsewhere classified: Secondary | ICD-10-CM | POA: Diagnosis not present

## 2024-02-13 DIAGNOSIS — Z96651 Presence of right artificial knee joint: Secondary | ICD-10-CM | POA: Diagnosis not present

## 2024-02-15 DIAGNOSIS — M25661 Stiffness of right knee, not elsewhere classified: Secondary | ICD-10-CM | POA: Diagnosis not present

## 2024-02-15 DIAGNOSIS — Z96651 Presence of right artificial knee joint: Secondary | ICD-10-CM | POA: Diagnosis not present

## 2024-02-17 DIAGNOSIS — Z96651 Presence of right artificial knee joint: Secondary | ICD-10-CM | POA: Diagnosis not present

## 2024-02-17 DIAGNOSIS — M25661 Stiffness of right knee, not elsewhere classified: Secondary | ICD-10-CM | POA: Diagnosis not present

## 2024-02-20 DIAGNOSIS — M25661 Stiffness of right knee, not elsewhere classified: Secondary | ICD-10-CM | POA: Diagnosis not present

## 2024-02-20 DIAGNOSIS — Z96651 Presence of right artificial knee joint: Secondary | ICD-10-CM | POA: Diagnosis not present

## 2024-02-22 DIAGNOSIS — M25661 Stiffness of right knee, not elsewhere classified: Secondary | ICD-10-CM | POA: Diagnosis not present

## 2024-02-22 DIAGNOSIS — Z96651 Presence of right artificial knee joint: Secondary | ICD-10-CM | POA: Diagnosis not present

## 2024-02-27 DIAGNOSIS — M25661 Stiffness of right knee, not elsewhere classified: Secondary | ICD-10-CM | POA: Diagnosis not present

## 2024-02-27 DIAGNOSIS — Z96651 Presence of right artificial knee joint: Secondary | ICD-10-CM | POA: Diagnosis not present

## 2024-03-01 DIAGNOSIS — Z96651 Presence of right artificial knee joint: Secondary | ICD-10-CM | POA: Diagnosis not present

## 2024-03-01 DIAGNOSIS — M25661 Stiffness of right knee, not elsewhere classified: Secondary | ICD-10-CM | POA: Diagnosis not present

## 2024-03-05 DIAGNOSIS — M25661 Stiffness of right knee, not elsewhere classified: Secondary | ICD-10-CM | POA: Diagnosis not present

## 2024-03-05 DIAGNOSIS — Z96651 Presence of right artificial knee joint: Secondary | ICD-10-CM | POA: Diagnosis not present

## 2024-03-08 DIAGNOSIS — Z96651 Presence of right artificial knee joint: Secondary | ICD-10-CM | POA: Diagnosis not present

## 2024-03-08 DIAGNOSIS — M25661 Stiffness of right knee, not elsewhere classified: Secondary | ICD-10-CM | POA: Diagnosis not present

## 2024-03-12 DIAGNOSIS — M25661 Stiffness of right knee, not elsewhere classified: Secondary | ICD-10-CM | POA: Diagnosis not present

## 2024-03-12 DIAGNOSIS — Z96651 Presence of right artificial knee joint: Secondary | ICD-10-CM | POA: Diagnosis not present

## 2024-03-14 DIAGNOSIS — Z96651 Presence of right artificial knee joint: Secondary | ICD-10-CM | POA: Diagnosis not present

## 2024-03-14 DIAGNOSIS — M25661 Stiffness of right knee, not elsewhere classified: Secondary | ICD-10-CM | POA: Diagnosis not present

## 2024-03-19 DIAGNOSIS — Z96651 Presence of right artificial knee joint: Secondary | ICD-10-CM | POA: Diagnosis not present

## 2024-03-19 DIAGNOSIS — M25661 Stiffness of right knee, not elsewhere classified: Secondary | ICD-10-CM | POA: Diagnosis not present

## 2024-03-27 DIAGNOSIS — Z96651 Presence of right artificial knee joint: Secondary | ICD-10-CM | POA: Diagnosis not present

## 2024-03-27 DIAGNOSIS — M25661 Stiffness of right knee, not elsewhere classified: Secondary | ICD-10-CM | POA: Diagnosis not present

## 2024-04-02 DIAGNOSIS — Z96651 Presence of right artificial knee joint: Secondary | ICD-10-CM | POA: Diagnosis not present

## 2024-04-02 DIAGNOSIS — M25661 Stiffness of right knee, not elsewhere classified: Secondary | ICD-10-CM | POA: Diagnosis not present

## 2024-04-05 DIAGNOSIS — Z96651 Presence of right artificial knee joint: Secondary | ICD-10-CM | POA: Diagnosis not present

## 2024-04-05 DIAGNOSIS — M25661 Stiffness of right knee, not elsewhere classified: Secondary | ICD-10-CM | POA: Diagnosis not present
